# Patient Record
Sex: Female | Born: 1990 | Race: White | Hispanic: No | Marital: Single | Smoking: Light tobacco smoker
Health system: Southern US, Community
[De-identification: ages and names within clinical notes are randomized; demographics above are authoritative.]

## PROBLEM LIST (undated history)

## (undated) DIAGNOSIS — F419 Anxiety disorder, unspecified: Secondary | ICD-10-CM

## (undated) DIAGNOSIS — F32A Depression, unspecified: Secondary | ICD-10-CM

## (undated) DIAGNOSIS — F329 Major depressive disorder, single episode, unspecified: Secondary | ICD-10-CM

## (undated) HISTORY — DX: Major depressive disorder, single episode, unspecified: F32.9

## (undated) HISTORY — DX: Depression, unspecified: F32.A

## (undated) HISTORY — DX: Anxiety disorder, unspecified: F41.9

---
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
## 2012-01-08 ENCOUNTER — Ambulatory Visit (INDEPENDENT_AMBULATORY_CARE_PROVIDER_SITE_OTHER): Payer: BC Managed Care – PPO | Admitting: Internal Medicine

## 2012-01-08 ENCOUNTER — Encounter: Payer: Self-pay | Admitting: Internal Medicine

## 2012-01-08 VITALS — BP 104/72 | HR 60 | Temp 98.4°F | Resp 16 | Ht 62.0 in | Wt 126.4 lb

## 2012-01-08 DIAGNOSIS — F329 Major depressive disorder, single episode, unspecified: Secondary | ICD-10-CM

## 2012-01-08 DIAGNOSIS — F4320 Adjustment disorder, unspecified: Secondary | ICD-10-CM | POA: Insufficient documentation

## 2012-01-08 MED ORDER — SERTRALINE HCL 100 MG PO TABS: 100.0000 mg | ORAL_TABLET | Freq: Every day | ORAL | Status: DC

## 2012-01-08 NOTE — Progress Notes (Signed)
She follows up for her third visit for depression, poor self-esteem, adolescent adjustment reaction.  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 Her mother favor staying in school. Her friends give her differentadvice, and she has trouble making up her mind . She continues with self reflection about the relationship that ended because she tested too much. She has not yet written a letter.  35 minute discussion focused on the way her self-perception paralyzes her-And discuss ways to get out of this to her counseling with psychologist kitchens.  Problem #1 depression secondary to poor self-esteem and adolescent adjustment disorder  Plan-continue Zoloft as it seems to be helping Continue counseling Followup 6 weeks sooner if worse

## 2012-02-26 ENCOUNTER — Ambulatory Visit: Payer: BC Managed Care – PPO | Admitting: Internal Medicine

## 2012-03-27 ENCOUNTER — Other Ambulatory Visit: Payer: Self-pay | Admitting: Internal Medicine

## 2012-04-15 ENCOUNTER — Encounter: Payer: Self-pay | Admitting: Internal Medicine

## 2012-04-15 ENCOUNTER — Ambulatory Visit (INDEPENDENT_AMBULATORY_CARE_PROVIDER_SITE_OTHER): Payer: BC Managed Care – PPO | Admitting: Internal Medicine

## 2012-04-15 VITALS — BP 106/80 | HR 60 | Temp 98.4°F | Resp 16 | Ht 62.5 in | Wt 137.4 lb

## 2012-04-15 DIAGNOSIS — F4320 Adjustment disorder, unspecified: Secondary | ICD-10-CM

## 2012-04-15 DIAGNOSIS — F329 Major depressive disorder, single episode, unspecified: Secondary | ICD-10-CM

## 2012-04-15 DIAGNOSIS — F988 Other specified behavioral and emotional disorders with onset usually occurring in childhood and adolescence: Secondary | ICD-10-CM

## 2012-04-15 MED ORDER — SERTRALINE HCL 100 MG PO TABS: 100.0000 mg | ORAL_TABLET | Freq: Every day | ORAL | Status: DC

## 2012-04-15 MED ORDER — AMPHETAMINE-DEXTROAMPHETAMINE 20 MG PO TABS: 20.0000 mg | ORAL_TABLET | Freq: Two times a day (BID) | ORAL | Status: DC

## 2012-04-15 NOTE — Progress Notes (Signed)
[id number] Patient Active Problem List  Diagnosis  . ADD (attention deficit disorder)  . Depression  . Adjustment disorder of adolescence    Trouble concentrating on schoolwork Is more difficult because now in school part-time with online courses. Medicine does help  HPI: Pt. Is here for F/U 20yo WF Going to school part time (online classes) and planning to travel to [place] for an independent study.  Feels like she is in rut with her schedule which includes working, doing homework, then sleeping.  She is currently living in [facility], and misses seeing her friends in [place] as frequently.  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  She finds time to workout and take piano lessons.  Her schedule is full and keeps her out of the house frequently.  She does not bump into her mom in the house often.  Her youngest brother is at school during the day and her older brother is out of the house often.  During the summer it was harder when she was home with them more frequently.  She spent time cleaning up their mess behind them and complains that her youngest brother is favored by her mother so he does not contribute to his household obligations.  Her mom expects her to help take care of her brothers mess; there is trouble between her and her mom when she does not clean up behind her brothers.  She does not c/o depression because she has been staying so busy.  She states "I like being this busy."  However, she admits to feeling sad that she is not involved in as may auditions during this year off from school.  She associates being an actress acting with her identity.  She shares that her past BF cheated on her and continues to mistreat her when they see each other out.  At first after the breakup she questioned herself, but now she realizes that she is better off without him and does not blame herself for their problems.  She would like to restart Adderall  because she is having trouble focusing on her online classes.  She also admits to rheumanations when going to bed, but is able to fall asleep within 10 minutes. She was able to get out of a relationship where he was cheating on her and recognized that she was worth more than that A/P  1. ADD (attention deficit disorder)   2. Adjustment disorder of adolescence   3. Depression      1) Pt education on life planning.She is doing well and progressing in search for who am I and who might I become 2) Pt education re Adderall and AE's. 3) Refill Adderall 20mg  bid x78mths--Will not use medicines and [place] 4) Refill Zoloft 100mg  qd x40mths--Will need medicines to cover trip to [place] 5) Great progress in relationship with mother/in with brothers  Followup in summer 2014

## 2012-05-24 ENCOUNTER — Telehealth: Payer: Self-pay

## 2012-05-26 NOTE — Progress Notes (Unsigned)
Received approval of prior auth for pt's Adderall. Faxed approval to pharmacy.

## 2013-03-26 ENCOUNTER — Ambulatory Visit (INDEPENDENT_AMBULATORY_CARE_PROVIDER_SITE_OTHER): Payer: BC Managed Care – PPO | Admitting: Internal Medicine

## 2013-03-26 VITALS — BP 102/68 | HR 70 | Temp 98.6°F | Resp 18 | Ht 62.75 in | Wt 125.8 lb

## 2013-03-26 DIAGNOSIS — F4320 Adjustment disorder, unspecified: Secondary | ICD-10-CM

## 2013-03-26 DIAGNOSIS — F988 Other specified behavioral and emotional disorders with onset usually occurring in childhood and adolescence: Secondary | ICD-10-CM

## 2013-03-26 MED ORDER — AMPHETAMINE-DEXTROAMPHETAMINE 20 MG PO TABS: 20.0000 mg | ORAL_TABLET | Freq: Two times a day (BID) | ORAL | Status: DC

## 2013-03-26 NOTE — Progress Notes (Signed)
  Subjective:    Patient ID: Mary Cole, female    DOB: 1990/12/27, 22 y.o.   MRN: [id number]  HPI here for followup after one year off from school  Could not get in to [place] so went to [place] instead and [place]  zoloft--gained 20 lbs so weaned//lost 10 lbs//mood has remained good New boyfriend/doing well Better living on her own for return to school instead of living with her parents [employer]. [employer]-theater//goal [place] in 2-3 yrs    Review of Systems Negative    Objective:   Physical Exam BP 102/68  Pulse 70  Temp(Src) 98.6 F (37 C) (Oral)  Resp 18  Ht 5' 2.75" (1.594 m)  Wt 125 lb 12.8 oz (57.063 kg)  BMI 22.46 kg/m2  SpO2 97%  LMP 02/14/2013 Normal exam       Assessment & Plan:  ADD (attention deficit disorder) - Plan: amphetamine-dextroamphetamine (ADDERALL) 20 MG tablet, amphetamine-dextroamphetamine (ADDERALL) 20 MG tablet, amphetamine-dextroamphetamine (ADDERALL) 20 MG tablet  Adjustment disorder of adolescence  Meds ordered this encounter  Medications  . amphetamine-dextroamphetamine (ADDERALL) 20 MG tablet    Sig: Take 1 tablet (20 mg total) by mouth 2 (two) times daily.    Dispense:  60 tablet    Refill:  0  . amphetamine-dextroamphetamine (ADDERALL) 20 MG tablet    Sig: Take 1 tablet (20 mg total) by mouth 2 (two) times daily.    Dispense:  60 tablet    Refill:  0     04/25/13  . amphetamine-dextroamphetamine (ADDERALL) 20 MG tablet    Sig: Take 1 tablet (20 mg total) by mouth 2 (two) times daily.    Dispense:  60 tablet    Refill:  0     05/26/13   Call in 3 months in followup in 6 months

## 2013-05-24 ENCOUNTER — Telehealth: Payer: Self-pay

## 2013-05-24 NOTE — Telephone Encounter (Signed)
PT STATES SHE RECEIVED A LETTER STATING HER INSURANCE WILL NO LONGER COVER HER ADDERALL UNLESS WE CALL THEM OURSELVES SAY IT IS OK TO CONTINUE YOU MAY REACH PT AT [phone] AND THE NUMBER TO CALL IS [phone]

## 2013-05-25 NOTE — Telephone Encounter (Signed)
Completed PA on phone and received approval, case # [id number], through 05/25/14. Notified pt.

## 2013-06-23 ENCOUNTER — Telehealth: Payer: Self-pay

## 2013-06-23 DIAGNOSIS — F988 Other specified behavioral and emotional disorders with onset usually occurring in childhood and adolescence: Secondary | ICD-10-CM

## 2013-06-23 NOTE — Telephone Encounter (Signed)
PT STATES SHE IS IN NEED OF HER ADDERALL. PLEASE CALL [phone] WHEN READY FOR PICK UP

## 2013-06-23 NOTE — Telephone Encounter (Signed)
Pended.

## 2013-06-25 MED ORDER — AMPHETAMINE-DEXTROAMPHETAMINE 20 MG PO TABS: 20.0000 mg | ORAL_TABLET | Freq: Two times a day (BID) | ORAL | Status: DC

## 2013-06-25 NOTE — Telephone Encounter (Signed)
Meds ordered this encounter  Medications  . amphetamine-dextroamphetamine (ADDERALL) 20 MG tablet    Sig: Take 1 tablet (20 mg total) by mouth 2 (two) times daily.    Dispense:  60 tablet    Refill:  0    Fill 60 days from date of prescription  . amphetamine-dextroamphetamine (ADDERALL) 20 MG tablet    Sig: Take 1 tablet (20 mg total) by mouth 2 (two) times daily.    Dispense:  60 tablet    Refill:  0  . amphetamine-dextroamphetamine (ADDERALL) 20 MG tablet    Sig: Take 1 tablet (20 mg total) by mouth 2 (two) times daily.    Dispense:  60 tablet    Refill:  0    Fill 30 days from date of prescription

## 2013-08-10 ENCOUNTER — Ambulatory Visit (INDEPENDENT_AMBULATORY_CARE_PROVIDER_SITE_OTHER): Payer: BC Managed Care – PPO | Admitting: Family Medicine

## 2013-08-10 VITALS — BP 104/68 | HR 97 | Temp 98.6°F | Resp 18 | Wt 119.0 lb

## 2013-08-10 DIAGNOSIS — Z202 Contact with and (suspected) exposure to infections with a predominantly sexual mode of transmission: Secondary | ICD-10-CM

## 2013-08-10 LAB — POCT WET PREP WITH KOH
Clue Cells Wet Prep HPF POC: NEGATIVE
KOH Prep POC: NEGATIVE
Trichomonas, UA: NEGATIVE
Yeast Wet Prep HPF POC: NEGATIVE

## 2013-08-10 LAB — POCT URINALYSIS DIPSTICK
Bilirubin, UA: NEGATIVE
Glucose, UA: NEGATIVE
Ketones, UA: NEGATIVE
Leukocytes, UA: NEGATIVE
Nitrite, UA: NEGATIVE
Protein, UA: NEGATIVE
Spec Grav, UA: 1.015
Urobilinogen, UA: 0.2
pH, UA: 7

## 2013-08-10 LAB — POCT UA - MICROSCOPIC ONLY
Casts, Ur, LPF, POC: NEGATIVE
Crystals, Ur, HPF, POC: NEGATIVE
Mucus, UA: POSITIVE
Yeast, UA: NEGATIVE

## 2013-08-10 MED ORDER — AZITHROMYCIN 250 MG PO TABS: ORAL_TABLET | ORAL | Status: DC

## 2013-08-10 NOTE — Progress Notes (Signed)
Subjective:    Patient ID: Mary Cole, female    DOB: 1990-11-28, 23 y.o.   MRN: [id number]  HPI  This chart was scribed for Kenyon AnaKurt Lauenstein-MD by Smiley HousemanFallon Davis, Scribe. This patient was seen in room 5 and the patient's care was started at 21 PM.  HPI Comments: Mary Cole is a [age] y.o. female who presents to the [facility] for STD testing.  Pt states that her boyfriend informed her today that he has chlamydia.  Pt states that she has had discharge recently, but she thought her pH was altered.  She is unsure of any spotting, because she was menstruating last week.  Pt states that her and her boyfriend haven't had intercourse recently.  The last time she had intercourse she states that it was painful.  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       No past surgical history on file.  Family History  Problem Relation Age of Onset   COPD Father     History   Social History   Marital Status: Single    Spouse Name: N/A    Number of Children: N/A   Years of Education: N/A   Occupational History   Not on file.   Social History Main Topics   Smoking status: Never Smoker    Smokeless tobacco: Not on file   Alcohol Use: Yes   Drug Use: No   Sexual Activity: Not on file   Other Topics Concern   Not on file   Social History Narrative   No narrative on file    No Known Allergies  Patient Active Problem List   Diagnosis Date Noted   Adjustment disorder of adolescence 01/08/2012   ADD (attention deficit disorder) 11/05/2011   Depression 11/05/2011    No results found for this or any previous visit.   Review of Systems  Constitutional: Negative for fever and chills.  HENT: Negative for congestion and rhinorrhea.   Respiratory: Negative for cough and shortness of breath.   Cardiovascular: Negative for chest pain.  Gastrointestinal: Negative for nausea, vomiting, abdominal pain  and diarrhea.  Genitourinary: Positive for vaginal discharge.  Musculoskeletal: Negative for back pain.  Skin: Negative for color change and rash.  Neurological: Negative for syncope.       Objective:   Physical Exam J. young woman in no acute distress who is alert and appropriate HEENT: Unremarkable Skin: Clear Abdomen: Soft nontender without HSM or tenderness Pelvic exam: Normal external genitalia, scant menses in the vaginal vault, normal cervix with one drop of blood in the os, normal bimanual Results for orders placed in visit on 08/10/13  POCT WET PREP WITH KOH      Result Value Range   Trichomonas, UA Negative     Clue Cells Wet Prep HPF POC neg     Epithelial Wet Prep HPF POC 0-5     Yeast Wet Prep HPF POC neg     Bacteria Wet Prep HPF POC 1+     RBC Wet Prep HPF POC 1-4     WBC Wet Prep HPF POC 0-2     KOH Prep POC Negative    POCT UA - MICROSCOPIC ONLY      Result Value Range   WBC, Ur, HPF, POC 0-4     RBC, urine, microscopic 2-8     Bacteria, U Microscopic 2+  Mucus, UA pos     Epithelial cells, urine per micros 4-6     Crystals, Ur, HPF, POC neg     Casts, Ur, LPF, POC neg     Yeast, UA neg    POCT URINALYSIS DIPSTICK      Result Value Range   Color, UA yellow     Clarity, UA clear     Glucose, UA neg     Bilirubin, UA neg     Ketones, UA neg     Spec Grav, UA 1.015     Blood, UA trace-lysed     pH, UA 7.0     Protein, UA neg     Urobilinogen, UA 0.2     Nitrite, UA neg     Leukocytes, UA Negative            Assessment & Plan:      Signed, Elvina Sidle, MD

## 2013-08-10 NOTE — Patient Instructions (Signed)
Chlamydia, Female °Chlamydia is an infection. It is spread from one person to another person during sexual contact. This infection can be in the cervix, urine tube (urethra), throat, or bottom (rectum). This infection needs treatment. °HOME CARE  °· Take your medicines (antibiotics) as told. Finish them even if you start to feel better. °· Only take medicine as told by your doctor. °· Tell your sex partner(s) that you have chlamydia. They must also be treated. °· Do not have sex until your doctor says it is okay. °· Rest. °· Eat healthy. Drink enough fluids to keep your pee (urine) clear or pale yellow. °· Keep all doctor visits as told. °GET HELP IF: °· You have pain when you pee. °· You have belly pain. °· You have vaginal discharge. °· You have pain during sex. °· You are a woman and have bleeding between periods and after sex. °GET HELP RIGHT AWAY IF:  °· You have a fever that will not go away after 3 days. °· You have a fever and your symptoms suddenly get worse. °· You feel sick to your stomach (nauseous) or you throw up (vomit). °· You sweat much more than normal (diaphoresis). °· You have trouble swallowing. °MAKE SURE YOU:  °· Understand these instructions. °· Will watch your condition. °· Will get help right away if you are not doing well or get worse. °Document Released: 04/16/2008 Document Revised: 04/28/2013 Document Reviewed: 03/15/2013 °ExitCare® Patient Information ©2014 [employer]. ° °

## 2013-08-12 ENCOUNTER — Telehealth: Payer: Self-pay

## 2013-08-12 LAB — GC/CHLAMYDIA PROBE AMP
CT Probe RNA: POSITIVE — AB
GC Probe RNA: NEGATIVE

## 2013-08-12 NOTE — Telephone Encounter (Signed)
Patient returning phone call to get lab results, at the moment it was very busy. Patient understood and says to just give her a call before 6 when she has class.   [phone]

## 2013-08-12 NOTE — Telephone Encounter (Signed)
See labs 

## 2013-09-02 ENCOUNTER — Ambulatory Visit (INDEPENDENT_AMBULATORY_CARE_PROVIDER_SITE_OTHER): Payer: BC Managed Care – PPO | Admitting: Physician Assistant

## 2013-09-02 VITALS — BP 100/60 | HR 72 | Temp 98.0°F | Resp 16 | Ht 62.0 in | Wt 118.0 lb

## 2013-09-02 DIAGNOSIS — J069 Acute upper respiratory infection, unspecified: Secondary | ICD-10-CM

## 2013-09-02 DIAGNOSIS — R0602 Shortness of breath: Secondary | ICD-10-CM

## 2013-09-02 MED ORDER — ALBUTEROL SULFATE (2.5 MG/3ML) 0.083% IN NEBU: 1.2500 mg | INHALATION_SOLUTION | Freq: Once | RESPIRATORY_TRACT | Status: DC

## 2013-09-02 NOTE — Progress Notes (Signed)
   Subjective:    Patient ID: Mary Cole, female    DOB: 1990/08/13, 23 y.o.   MRN: [id number]  HPI Pt presents to clinic with 3 day h/o cold symptoms - she has congestion with some green rhinorrhea and she is here mainly because she had blood in her mucus today.  She is also feeling slight SOB and cannot get a full deep breath.  She is not coughing and has not heard wheezing.  Her brother and father both have asthma but she has never had breathing problems.    OTC meds - cold prep, emergency packets Sick contacts - school peers - [employer] student Flu vaccine - no  Review of Systems  Constitutional: Positive for fever (subjective 4 days ago) and chills.  HENT: Positive for congestion, rhinorrhea (green with blood) and sore throat (irritated).   Respiratory: Negative for cough, shortness of breath and wheezing.   Musculoskeletal: Positive for myalgias.  Neurological: Positive for dizziness and headaches.       Objective:   Physical Exam  Vitals reviewed. Constitutional: She is oriented to person, place, and time. She appears well-developed and well-nourished.  HENT:  Head: Normocephalic and atraumatic.  Right Ear: External ear normal.  Left Ear: External ear normal.  Eyes: Conjunctivae are normal.  Neck: Normal range of motion.  Cardiovascular: Normal rate, regular rhythm and normal heart sounds.   No murmur heard. Pulmonary/Chest: Effort normal and breath sounds normal. No respiratory distress. She has no wheezes.  Lymphadenopathy:       Head (right side): No tonsillar, no preauricular and no occipital adenopathy present.       Head (left side): No tonsillar, no preauricular and no occipital adenopathy present.    She has no cervical adenopathy.       Right: No supraclavicular adenopathy present.       Left: No supraclavicular adenopathy present.  Neurological: She is alert and oriented to person, place, and time.  Skin: Skin is warm and dry.  Psychiatric: She has a normal  mood and affect. Her behavior is normal. Judgment and thought content normal.   Due to patient's complaints of SOB I gave her an albuterol neb - she felt no change in her symptoms and thinks it might be just feeling bad.     Assessment & Plan:  SOB (shortness of breath) - Plan: albuterol (PROVENTIL) (2.5 MG/3ML) 0.083% nebulizer solution 1.25 mg - pt got no relief from the albuterol and her exam was normal so I wonder if pt is just fatigued and body is fighting the URI and her energy level is less giving her the sensation of SOB and she agrees that she thinks that is what this is.  She will RTC if the symptoms worsen or she starts to have wheezing.  URI with cough - pt will use Mucinex that she has at home.  She will do symptomatic treatment and try her neti-pot and nasal saline spray for nasal moisture.  She will take motrin and tylenol for myalgias.  Benny LennertSarah Aleysha Meckler PA-C 09/02/2013 7:06 PM

## 2013-10-30 ENCOUNTER — Telehealth: Payer: Self-pay

## 2013-10-30 DIAGNOSIS — F988 Other specified behavioral and emotional disorders with onset usually occurring in childhood and adolescence: Secondary | ICD-10-CM

## 2013-10-30 NOTE — Telephone Encounter (Signed)
Patient needs a refill on Adderall (gets them 3 months at a time).  [phone]

## 2013-11-01 MED ORDER — AMPHETAMINE-DEXTROAMPHETAMINE 20 MG PO TABS: 20.0000 mg | ORAL_TABLET | Freq: Two times a day (BID) | ORAL | Status: DC

## 2013-11-01 NOTE — Telephone Encounter (Signed)
Dr Merla Richesoolittle, pt has been in for other acute issues, but hasn't seen you for ADD since 03/2013. Do you want to give her any RFs?

## 2013-11-01 NOTE — Telephone Encounter (Signed)
Have her set appt!!! Meds ordered this encounter  Medications  . amphetamine-dextroamphetamine (ADDERALL) 20 MG tablet    Sig: Take 1 tablet (20 mg total) by mouth 2 (two) times daily.    Dispense:  60 tablet    Refill:  0

## 2013-11-02 NOTE — Telephone Encounter (Signed)
Notified pt 1 mos ready but she needs f/up for more. Pt agreed.

## 2014-01-18 ENCOUNTER — Ambulatory Visit (INDEPENDENT_AMBULATORY_CARE_PROVIDER_SITE_OTHER): Payer: BC Managed Care – PPO | Admitting: Internal Medicine

## 2014-01-18 VITALS — BP 90/68 | HR 53 | Temp 98.2°F | Resp 20 | Ht 62.0 in | Wt 116.5 lb

## 2014-01-18 DIAGNOSIS — F988 Other specified behavioral and emotional disorders with onset usually occurring in childhood and adolescence: Secondary | ICD-10-CM

## 2014-01-18 DIAGNOSIS — N899 Noninflammatory disorder of vagina, unspecified: Secondary | ICD-10-CM

## 2014-01-18 DIAGNOSIS — N898 Other specified noninflammatory disorders of vagina: Secondary | ICD-10-CM

## 2014-01-18 DIAGNOSIS — N949 Unspecified condition associated with female genital organs and menstrual cycle: Secondary | ICD-10-CM

## 2014-01-18 DIAGNOSIS — N72 Inflammatory disease of cervix uteri: Secondary | ICD-10-CM

## 2014-01-18 DIAGNOSIS — Z113 Encounter for screening for infections with a predominantly sexual mode of transmission: Secondary | ICD-10-CM

## 2014-01-18 DIAGNOSIS — N9489 Other specified conditions associated with female genital organs and menstrual cycle: Secondary | ICD-10-CM

## 2014-01-18 LAB — POCT WET PREP WITH KOH
KOH Prep POC: NEGATIVE
Trichomonas, UA: NEGATIVE
YEAST WET PREP PER HPF POC: NEGATIVE

## 2014-01-18 MED ORDER — AMPHETAMINE-DEXTROAMPHETAMINE 20 MG PO TABS: 20.0000 mg | ORAL_TABLET | Freq: Two times a day (BID) | ORAL | Status: DC

## 2014-01-18 MED ORDER — AZITHROMYCIN 500 MG PO TABS: 1000.0000 mg | ORAL_TABLET | Freq: Once | ORAL | Status: DC

## 2014-01-18 MED ORDER — CEFTRIAXONE SODIUM 1 G IJ SOLR
250.0000 mg | Freq: Once | INTRAMUSCULAR | Status: AC
  Administered 2014-01-18: 250 mg via INTRAMUSCULAR

## 2014-01-18 NOTE — Patient Instructions (Signed)
We have given you an injection of antibiotics today.  Take the 2 azithromycin pills at one time tomorrow.  This will cover you for both gonorrhea and chlamydia.  This may be overkill, but I am concerned because you were so tender on exam.  Abstain from sexual activity for the next 7 days.  I will be in touch about your labs as soon as they are back (usually about 48 hours)

## 2014-01-18 NOTE — Progress Notes (Addendum)
Subjective:    Patient ID: Mary Cole, female    DOB: 06/10/1991, 23 y.o.   MRN: [id number]  HPI   Ms. Mary Cole is a very pleasant 23 yr old female here with concern for 2 days of vaginal irritation.  She has associated decreased appetite and low back pain - though these symptoms predated the vaginal symptoms.  She noted some bleeding after sex recently, as well as associated burning.  She denies pain with intercourse.  She denies vaginal discharge or urinary symptoms.  LMP 01/07/14.  She uses OCPs for contraception.  Currently sexually active with 1 female partner - no condoms.  No specific concern for STI but would like testing . Last tested a few month ago at which time she was positive for chlamydia.  She was treated for this.  She denies fever, chills, abd pain, NV.     Review of Systems  Constitutional: Positive for appetite change. Negative for fever and chills.  Respiratory: Negative for cough, shortness of breath and wheezing.   Gastrointestinal: Negative for nausea, vomiting and abdominal pain.  Genitourinary: Positive for vaginal bleeding. Negative for dysuria, hematuria, vaginal discharge, genital sores, pelvic pain and dyspareunia.  Musculoskeletal: Positive for back pain.       Objective:   Physical Exam  Vitals reviewed. Constitutional: She is oriented to person, place, and time. She appears well-developed and well-nourished. No distress.  HENT:  Head: Normocephalic and atraumatic.  Eyes: Conjunctivae are normal. No scleral icterus.  Cardiovascular: Normal rate, regular rhythm and normal heart sounds.   Pulmonary/Chest: Effort normal and breath sounds normal. She has no wheezes. She has no rales.  Abdominal: Soft. Bowel sounds are normal. There is no tenderness. There is no CVA tenderness.  Genitourinary: There is no rash, tenderness or lesion on the right labia. There is no rash, tenderness or lesion on the left labia. Cervix exhibits motion tenderness and discharge.  Right adnexum displays tenderness. Right adnexum displays no mass and no fullness. Left adnexum displays no mass, no tenderness and no fullness. No vaginal discharge found.  Neurological: She is alert and oriented to person, place, and time.  Skin: Skin is warm and dry.  Psychiatric: She has a normal mood and affect. Her behavior is normal.    Results for orders placed in visit on 01/18/14  POCT WET PREP WITH KOH      Result Value Ref Range   Trichomonas, UA Negative     Clue Cells Wet Prep HPF POC 0-2     Epithelial Wet Prep HPF POC 2-3     Yeast Wet Prep HPF POC neg     Bacteria Wet Prep HPF POC 2+     RBC Wet Prep HPF POC 0-2     WBC Wet Prep HPF POC 15-25     KOH Prep POC Negative          Assessment & Plan:  Vaginal irritation - Plan: POCT Wet Prep with KOH, GC/Chlamydia Probe Amp  Screen for STD (sexually transmitted disease) - Plan: POCT Wet Prep with KOH, GC/Chlamydia Probe Amp, HIV antibody, RPR  ADD (attention deficit disorder) - Plan: amphetamine-dextroamphetamine (ADDERALL) 20 MG tablet  Cervicitis - Plan: cefTRIAXone (ROCEPHIN) injection 250 mg, azithromycin (ZITHROMAX) 500 MG tablet  Cervical motion tenderness - Plan: cefTRIAXone (ROCEPHIN) injection 250 mg, azithromycin (ZITHROMAX) 500 MG tablet   Ms. Mary Cole is a pleasant 23 yr old female here with complaint of vaginal irritation.  She had some associated post-coital bleeding.  On exam there is purulent discharge from the cervix as well as CMT.  Because of this, favor treating empirically fo gc/chlamydia.  Pt agrees with this.  CTX given in clinic.  Azithro sent to pharmacy.  Labs pending.  Pt to call or RTC if worsening or not improving  E. Frances FurbishElizabeth Egan MHS, PA-C [facility] 6/30/201510:46 PM    I have completed the patient encounter in its entirety as documented by PA Debbra RidingEgan, with editing by me where necessary. Robert P. Merla Richesoolittle, M.D.

## 2014-01-18 NOTE — Progress Notes (Signed)
Patient ID: Mary Cole, female   DOB: 06-May-1991, 23 y.o.   MRN: [id number] This chart was scribed for Ellamae Siaobert Doolittle, MD by Joaquin MusicKristina Sanchez-Matthews, ED Scribe. This patient was seen in room Room/bed 12 and the patient's care was started at 10:08 PM.  Chief Complaint  Patient presents with  . Medication Refill    Adderall   Mary Cole is a 23 y.o. female with hx of ADD and depression who [facility], Adderall, refill. 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 [place] [place] [place]. 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 She is requesting a refill of her Adderall; "has not had it for a while". She has noticed that she struggles with reading a full page of plays due to inability to focus. Pt states she use to read for fun but states after 3 pages into a book, she does not realize what she is reading.  Pt states she has the same sexual partner. Last Chlamydia tx was March 2015.  Review of systems and exam both stable  For attention deficit disorder she will continue medications Meds ordered this encounter  Medications  . amphetamine-dextroamphetamine (ADDERALL) 20 MG tablet    Sig: Take 1 tablet (20 mg total) by mouth 2 (two) times daily. For 30 d after date signed    Dispense:  60 tablet    Refill:  0  . amphetamine-dextroamphetamine (ADDERALL) 20 MG tablet    Sig: Take 1 tablet (20 mg total) by mouth 2 (two) times daily. For 60d after date signed    Dispense:  60 tablet    Refill:  0  . amphetamine-dextroamphetamine (ADDERALL) 20 MG tablet    Sig: Take 1 tablet (20 mg total) by mouth 2 (two) times daily.    Dispense:  60 tablet    Refill:  0   See the remainder of this office visit under the note completed by [facility] Debbra RidingEgan     I have completed the patient encounter in its entirety as documented by the scribe and PAC, with  editing by me where necessary. Robert P. Merla Richesoolittle, M.D.

## 2014-01-19 LAB — RPR

## 2014-01-19 LAB — HIV ANTIBODY (ROUTINE TESTING W REFLEX): HIV 1&2 Ab, 4th Generation: NONREACTIVE

## 2014-01-20 LAB — GC/CHLAMYDIA PROBE AMP
CT PROBE, AMP APTIMA: POSITIVE — AB
GC Probe RNA: NEGATIVE

## 2014-01-22 ENCOUNTER — Telehealth: Payer: Self-pay

## 2014-01-22 NOTE — Telephone Encounter (Signed)
PATIENT STATES SHE SAW ELIZABETH EGAN ON Tuesday FOR A VAGINAL IRRITATION. SHE GAVE HER AN INJECTION AND ALSO PRESCRIBED HER AZITHROMYCIN. SHE STARTED TO FEEL BETTER AFTER SHE FIRST STARTED TO USE IT, BUT A FEW DAYS AGO SHE STARTED TO GET SWOLLEN AND IRRITATED AGAIN. SHE WAS ALSO DIAGNOSED WITH CHLAMYDIA. SHE IS NOT SURE IF THE MEDICATION IS NOT WORKING OR IF SHE IS HAVING AN ALLERGIC REACTION. [place] [facility]  BEST PHONE [phone] (CELL)  IF SHE NEEDS A DIFFERENT MEDICATION, [place]ARMACY [place] WHEN WE CALL HER BACK.  MBC

## 2014-01-23 NOTE — Telephone Encounter (Signed)
Please advise. Note indicates call or come in, [place].

## 2014-01-23 NOTE — Telephone Encounter (Signed)
She needs re-evaluation, either here or where she is.

## 2014-01-24 NOTE — Telephone Encounter (Signed)
LM for rtn call. 

## 2014-01-24 NOTE — Telephone Encounter (Signed)
Pt states she will RTC- transferred to try to make an appt.

## 2015-05-11 ENCOUNTER — Ambulatory Visit (INDEPENDENT_AMBULATORY_CARE_PROVIDER_SITE_OTHER): Payer: BC Managed Care – PPO

## 2015-05-11 ENCOUNTER — Ambulatory Visit (INDEPENDENT_AMBULATORY_CARE_PROVIDER_SITE_OTHER): Payer: BC Managed Care – PPO | Admitting: Physician Assistant

## 2015-05-11 ENCOUNTER — Telehealth: Payer: Self-pay | Admitting: Family Medicine

## 2015-05-11 VITALS — BP 118/76 | HR 62 | Temp 98.7°F | Resp 16 | Ht 62.0 in | Wt 130.0 lb

## 2015-05-11 DIAGNOSIS — R059 Cough, unspecified: Secondary | ICD-10-CM

## 2015-05-11 DIAGNOSIS — R05 Cough: Secondary | ICD-10-CM

## 2015-05-11 DIAGNOSIS — R0602 Shortness of breath: Secondary | ICD-10-CM | POA: Diagnosis not present

## 2015-05-11 LAB — POCT CBC
Granulocyte percent: 68.3 %G (ref 37–80)
HEMATOCRIT: 39.8 % (ref 37.7–47.9)
Hemoglobin: 13.6 g/dL (ref 12.2–16.2)
Lymph, poc: 2 (ref 0.6–3.4)
MCH: 32.8 pg — AB (ref 27–31.2)
MCHC: 34.3 g/dL (ref 31.8–35.4)
MCV: 95.6 fL (ref 80–97)
MID (CBC): 0.3 (ref 0–0.9)
MPV: 7.1 fL (ref 0–99.8)
POC GRANULOCYTE: 5 (ref 2–6.9)
POC LYMPH PERCENT: 27.2 %L (ref 10–50)
POC MID %: 4.5 %M (ref 0–12)
Platelet Count, POC: 243 10*3/uL (ref 142–424)
RBC: 4.16 M/uL (ref 4.04–5.48)
RDW, POC: 12.5 %
WBC: 7.3 10*3/uL (ref 4.6–10.2)

## 2015-05-11 LAB — D-DIMER, QUANTITATIVE: D-Dimer, Quant: 0.28 ug/mL-FEU (ref 0.00–0.48)

## 2015-05-11 LAB — POCT URINE PREGNANCY: Preg Test, Ur: NEGATIVE

## 2015-05-11 MED ORDER — ALBUTEROL SULFATE (2.5 MG/3ML) 0.083% IN NEBU
2.5000 mg | INHALATION_SOLUTION | Freq: Once | RESPIRATORY_TRACT | Status: AC
  Administered 2015-05-11: 2.5 mg via RESPIRATORY_TRACT

## 2015-05-11 MED ORDER — HYDROCODONE-HOMATROPINE 5-1.5 MG/5ML PO SYRP
5.0000 mL | ORAL_SOLUTION | Freq: Three times a day (TID) | ORAL | Status: AC | PRN
Start: 2015-05-11 — End: ?

## 2015-05-11 MED ORDER — BENZONATATE 100 MG PO CAPS: 100.0000 mg | ORAL_CAPSULE | Freq: Three times a day (TID) | ORAL | Status: AC | PRN

## 2015-05-11 MED ORDER — IPRATROPIUM BROMIDE 0.02 % IN SOLN
0.5000 mg | Freq: Once | RESPIRATORY_TRACT | Status: AC
  Administered 2015-05-11: 0.5 mg via RESPIRATORY_TRACT

## 2015-05-11 MED ORDER — PREDNISONE 20 MG PO TABS: 40.0000 mg | ORAL_TABLET | Freq: Every day | ORAL | Status: AC

## 2015-05-11 NOTE — Progress Notes (Signed)
05/12/2015 at 2:54 PM  Mary Cole / DOB: 07-Dec-1990 / MRN: [id number]  The patient has ADD (attention deficit disorder); Depression; and Adjustment disorder of adolescence on her problem list.  SUBJECTIVE  Mary Cole is a 24 y.o. female who complains of cough and mild SOB that started this morning.  Reports she is a daily smoker and takes OCPs.  Smokes roughly 3-4 cigarettes daily.  Had a cold with rhinorrhea, sore throat, and nasal congestion roughly 3 weeks ago. Her cough started after her other symptoms resolved.  Reports she was able to run six miles yesterday.  She is concerned about her SOB. Denies chest pain and posterior leg pain or pain with ambulation.   She  has a past medical history of Depression and Anxiety.    Medications reviewed and updated by myself where necessary, and exist elsewhere in the encounter.   Ms. Rzasa has No Known Allergies. She  reports that she has been smoking.  She has never used smokeless tobacco. She reports that she drinks alcohol. She reports that she does not use illicit drugs. She  has no sexual activity history on file. The patient  has no past surgical history on file.  Her family history includes COPD in her father.  Review of Systems  Constitutional: Negative for fever.  HENT: Negative for congestion and sore throat.   Respiratory: Positive for cough and shortness of breath (improved post duo neb x 1).   Cardiovascular: Negative for chest pain.  Genitourinary: Negative for dysuria.  Musculoskeletal: Negative for myalgias.  Skin: Negative for rash.  Neurological: Negative for dizziness and headaches.    OBJECTIVE  Her  height is  (1.575 m) and weight is 130 lb (58.968 kg). Her oral temperature is 98.7 F (37.1 C). Her blood pressure is 118/76 and her pulse is 62. Her respiration is 16 and oxygen saturation is 98%.  The patient's body mass index is 23.77 kg/(m^2).  Physical Exam  Constitutional: She is oriented to  person, place, and time. She appears well-developed and well-nourished. No distress.  HENT:  Right Ear: Hearing, tympanic membrane, external ear and ear canal normal.  Left Ear: Hearing, tympanic membrane, external ear and ear canal normal.  Nose: No mucosal edema. Right sinus exhibits no maxillary sinus tenderness and no frontal sinus tenderness. Left sinus exhibits no maxillary sinus tenderness and no frontal sinus tenderness.  Mouth/Throat: Uvula is midline, oropharynx is clear and moist and mucous membranes are normal.  Cardiovascular: Normal rate, regular rhythm, normal heart sounds and intact distal pulses.  Exam reveals no gallop and no friction rub.   No murmur heard. Respiratory: Effort normal and breath sounds normal. She has no wheezes. She has no rales.  Musculoskeletal: Normal range of motion.  Neurological: She is alert and oriented to person, place, and time.  Skin: Skin is warm and dry. She is not diaphoretic.  Psychiatric: She has a normal mood and affect.    Results for orders placed or performed in visit on 05/11/15 (from the past 24 hour(s))  POCT CBC     Status: Abnormal   Collection Time: 05/11/15  7:44 PM  Result Value Ref Range   WBC 7.3 4.6 - 10.2 K/uL   Lymph, poc 2.0 0.6 - 3.4   POC LYMPH PERCENT 27.2 10 - 50 %L   MID (cbc) 0.3 0 - 0.9   POC MID % 4.5 0 - 12 %M   POC Granulocyte 5.0 2 - 6.9  Granulocyte percent 68.3 37 - 80 %G   RBC 4.16 4.04 - 5.48 M/uL   Hemoglobin 13.6 12.2 - 16.2 g/dL   HCT, POC 16.139.8 09.637.7 - 47.9 %   MCV 95.6 80 - 97 fL   MCH, POC 32.8 (A) 27 - 31.2 pg   MCHC 34.3 31.8 - 35.4 g/dL   RDW, POC 04.512.5 %   Platelet Count, POC 243 142 - 424 K/uL   MPV 7.1 0 - 99.8 fL  POCT urine pregnancy     Status: None   Collection Time: 05/11/15  7:44 PM  Result Value Ref Range   Preg Test, Ur Negative Negative  D-dimer, quantitative (not at [facility])     Status: None   Collection Time: 05/11/15  8:25 PM  Result Value Ref Range   D-Dimer, Quant 0.28  0.00 - 0.48 ug/mL-FEU   Narrative   Performed at:  [facility]                [address], [address]                [place], [place] [address]   [facility] reading (PRIMARY) by  Dr. Neva SeatGreene: Negative.   ASSESSMENT & PLAN  Zollie ScaleOlivia was seen today for chest congestion and immunizations.  Diagnoses and all orders for this visit:  Cough -     POCT CBC -     DG Chest 2 View; Future -     POCT urine pregnancy -     albuterol (PROVENTIL) (2.5 MG/3ML) 0.083% nebulizer solution 2.5 mg; Take 3 mLs (2.5 mg total) by nebulization once. -     ipratropium (ATROVENT) nebulizer solution 0.5 mg; Take 2.5 mLs (0.5 mg total) by nebulization once. -     predniSONE (DELTASONE) 20 MG tablet; Take 2 tablets (40 mg total) by mouth daily with breakfast. -     benzonatate (TESSALON) 100 MG capsule; Take 1-2 capsules (100-200 mg total) by mouth 3 (three) times daily as needed for cough. -     HYDROcodone-homatropine (HYCODAN) 5-1.5 MG/5ML syrup; Take 5 mLs by mouth every 8 (eight) hours as needed for cough.  SOB (shortness of breath): D-dimer negative.  Patient did improve with duoneb, however lung exam did not change.  Most likely secondary to post viral cough/bronchitis.  Given improvement with breathing treatment will try low dose steroid burst.  Advised that if her symptoms change to RTC or go directly to the ED for further evaluation and management.  -     D-dimer, quantitative (not at [facility]) -     albuterol (PROVENTIL) (2.5 MG/3ML) 0.083% nebulizer solution 2.5 mg; Take 3 mLs (2.5 mg total) by nebulization once. -     ipratropium (ATROVENT) nebulizer solution 0.5 mg; Take 2.5 mLs (0.5 mg total) by nebulization once.    The patient was advised to call or come back to clinic if she does not see an improvement in symptoms, or worsens with the above plan.   Deliah BostonMichael Kamoni Gentles, MHS, PA-C [facility] 05/12/2015 2:54 PM

## 2015-05-11 NOTE — Telephone Encounter (Signed)
Call from [facility]. DDimer normal at 0.28. Lab - Please call pt with normal result.  To Mary BostonMichael Cole - FYI.

## 2015-05-12 NOTE — Telephone Encounter (Signed)
LMOM of normal results.

## 2015-07-01 ENCOUNTER — Encounter (HOSPITAL_COMMUNITY): Payer: Self-pay | Admitting: Emergency Medicine

## 2015-07-01 ENCOUNTER — Other Ambulatory Visit (HOSPITAL_COMMUNITY)
Admission: RE | Admit: 2015-07-01 | Discharge: 2015-07-01 | Disposition: A | Discharge status: Home / Self Care | Payer: BC Managed Care – PPO | Source: Ambulatory Visit | Attending: Family Medicine | Admitting: Family Medicine

## 2015-07-01 ENCOUNTER — Emergency Department (INDEPENDENT_AMBULATORY_CARE_PROVIDER_SITE_OTHER)
Admission: EM | Admit: 2015-07-01 | Discharge: 2015-07-01 | Disposition: A | Discharge status: Home / Self Care | Payer: BC Managed Care – PPO | Source: Home / Self Care | Attending: Family Medicine | Admitting: Family Medicine

## 2015-07-01 DIAGNOSIS — L290 Pruritus ani: Secondary | ICD-10-CM | POA: Diagnosis not present

## 2015-07-01 DIAGNOSIS — N760 Acute vaginitis: Secondary | ICD-10-CM | POA: Insufficient documentation

## 2015-07-01 DIAGNOSIS — Z113 Encounter for screening for infections with a predominantly sexual mode of transmission: Secondary | ICD-10-CM | POA: Diagnosis not present

## 2015-07-01 LAB — POCT URINALYSIS DIP (DEVICE)
BILIRUBIN URINE: NEGATIVE
Glucose, UA: NEGATIVE mg/dL
HGB URINE DIPSTICK: NEGATIVE
KETONES UR: NEGATIVE mg/dL
Leukocytes, UA: NEGATIVE
Nitrite: NEGATIVE
PH: 7 (ref 5.0–8.0)
PROTEIN: NEGATIVE mg/dL
Specific Gravity, Urine: 1.015 (ref 1.005–1.030)
Urobilinogen, UA: 0.2 mg/dL (ref 0.0–1.0)

## 2015-07-01 LAB — POCT PREGNANCY, URINE: Preg Test, Ur: NEGATIVE

## 2015-07-01 MED ORDER — HYDROCORTISONE ACETATE 25 MG RE SUPP: 25.0000 mg | Freq: Every day | RECTAL | Status: AC

## 2015-07-01 NOTE — Discharge Instructions (Signed)
It was nice seeing you today. I will call with all test results as soon as I get them. Please see your PCP soon if still having anal itching. Use anusol supp for now.  Anal Pruritus  Anal pruritus is an itchy feeling in the anus and the skin in the anal area. This is common and can be caused by many things. It often occurs when the area becomes moist. Moisture may be due to sweating or a small amount of stool (feces) that is left on the area because of poor personal cleaning. Some other causes include:  Perfumed soaps and sprays.  Colored toilet paper.  Chemicals in the foods that you eat.  Dietary factors, such as caffeine, beer, milk products, chocolate, nuts, citrus fruits, tomatoes, spicy seasonings, jalapeno peppers, and salsa.  Hemorrhoids, fissures, infections, and other anal diseases.  Excessive washing.  Overuse of laxatives.  Skin disorders (psoriasis, eczema, or seborrhea).  Some medical disorders, such as diabetes or thyroid problems.  Diarrhea.  STDs (sexually transmitted diseases).  Some cancers. In many cases, the cause is not known. The itching usually goes away with treatment and home care. Scratching can cause further skin damage. HOME CARE INSTRUCTIONS  Pay attention to any changes in your symptoms. Take these actions to help with your itching: Skin Care  Practice good hygiene.  Clean the anal area gently with wet toilet paper, baby wipes, or a wet washcloth after every bowel movement and at bedtime.  Avoid using soaps on the anal area.  Dry the area thoroughly. Pat the area dry with toilet paper or a towel.  Do not scrub the anal area with anything, including toilet paper.  Do not scratch the itchy area. Scratching produces more damage and makes the itching worse.  Take sitz baths in warm water as told by your health care provider. Pat the area dry with a soft cloth after each bath.  Use creams or ointments as told by your health care provider. Zinc  oxide ointment or a moisture barrier cream can be applied several times per day to protect the skin.  Do not use anything that irritates the skin, such as bubble baths, scented toilet paper, or genital deodorants. General Instructions  Take over-the-counter and prescription medicines only as told by your health care provider.  Talk with your health care provider about fiber supplements. These are helpful in keeping your stool normal if you have frequent loose stools.  Wear cotton underwear and loose clothing.  Keep all follow-up visits as told by your health care provider. This is important. SEEK MEDICAL CARE IF:  Your itching does not improve in several days.  Your itching gets worse.  You have a fever.  You have redness, swelling, or pain in the anal area.  You have fluid, blood, or pus coming from the anal area.   This information is not intended to replace advice given to you by your health care provider. Make sure you discuss any questions you have with your health care provider.   Document Released: 01/07/2011 Document Revised: 03/29/2015 Document Reviewed: 10/03/2014 Elsevier Interactive Patient Education [employer]2016 Elsevier Inc.

## 2015-07-01 NOTE — ED Provider Notes (Signed)
CSN: [id number]     Arrival date & time 07/01/15  1450 History   First MD Initiated Contact with Patient 07/01/15 1640     Chief Complaint  Patient presents with  . Exposure to STD  . Anal Itching   (Consider location/radiation/quality/duration/timing/severity/associated sxs/prior Treatment) Patient is a 24 y.o. female presenting with vaginal discharge. The history is provided by the patient. No language interpreter was used.  Vaginal Discharge Quality:  Watery and white Severity:  Severe Onset quality:  Gradual Duration:  2 weeks Timing:  Constant Progression:  Worsening Chronicity:  New Context: not after intercourse, not after urination and not during intercourse   Context comment:  Occurs anytime. At times it worsens after exercise Relieved by:  Nothing Worsened by:  Nothing tried Ineffective treatments:  None tried Associated symptoms: no abdominal pain, no dyspareunia, no dysuria, no fever, no nausea, no rash, no vaginal itching and no vomiting   Associated symptoms comment:  Unprotected sex 1 month ago. Number of sexual sexual partner in the last 6 month is about 3-4. Risk factors: STI exposure and unprotected sex   Risk factors: no new sexual partner   Anal itching: C/O itching on her anus 3 weeks ago, it started of with some funny feeling on her anus. She has been using Pre-H this made her feel better. Now she is also itching internally. She stated she has hx of hypochondriasis so she is also worried about cancer.  Past Medical History  Diagnosis Date  . Depression   . Anxiety    History reviewed. No pertinent past surgical history. Family History  Problem Relation Age of Onset  . COPD Father    Social History  Substance Use Topics  . Smoking status: Light Tobacco Smoker  . Smokeless tobacco: Never Used  . Alcohol Use: 0.0 oz/week    0 Standard drinks or equivalent per week   OB History    No data available     Review of Systems  Constitutional: Negative  for fever.  Respiratory: Negative.   Cardiovascular: Negative.   Gastrointestinal: Negative for nausea, vomiting and abdominal pain.  Genitourinary: Positive for vaginal discharge. Negative for dysuria, urgency, decreased urine volume, difficulty urinating, genital sores, pelvic pain and dyspareunia.       Internal anal itching. Vaginal discharge  All other systems reviewed and are negative.   Allergies  Review of patient's allergies indicates no known allergies.  Home Medications   Prior to Admission medications   Medication Sig Start Date End Date Taking? Authorizing Provider  benzonatate (TESSALON) 100 MG capsule Take 1-2 capsules (100-200 mg total) by mouth 3 (three) times daily as needed for cough. 05/11/15   Ofilia NeasMichael L Clark, PA-C  HYDROcodone-homatropine [facility](HYCODAN) 5-1.5 MG/5ML syrup Take 5 mLs by mouth every 8 (eight) hours as needed for cough. 05/11/15   Ofilia NeasMichael L Clark, PA-C  Norethindrone Acetate-Ethinyl Estradiol (LOESTRIN 1.5/30, 21,) 1.5-30 MG-MCG tablet Take 1 tablet by mouth daily.    Historical Provider, MD  predniSONE (DELTASONE) 20 MG tablet Take 2 tablets (40 mg total) by mouth daily with breakfast. 05/11/15   Ofilia NeasMichael L Clark, PA-C   Meds Ordered and Administered this Visit  Medications - No data to display  BP 98/64 mmHg  Pulse 69  Temp(Src) 98.4 F (36.9 C) (Oral)  SpO2 100%  LMP 06/24/2015 No data found.   Physical Exam  Constitutional: She appears well-developed. No distress.  Cardiovascular: Normal rate, regular rhythm and normal heart sounds.   No murmur heard.  Pulmonary/Chest: Effort normal and breath sounds normal. No respiratory distress. She has no wheezes.  Abdominal: Soft. Bowel sounds are normal. She exhibits no distension. There is no tenderness.  Genitourinary: Rectal exam shows no external hemorrhoid, no internal hemorrhoid, no fissure, no mass, no tenderness and anal tone normal. No labial fusion. There is no tenderness on the right labia.  There is no tenderness on the left labia. Cervix exhibits no motion tenderness and no discharge. Vaginal discharge found.  I was unable to use anoscope to examine her today.  Nursing note and vitals reviewed.   ED Course  Procedures (including critical care time)  Labs Review Labs Reviewed - No data to display  Imaging Review No results found.   Visual Acuity Review  Right Eye Distance:   Left Eye Distance:   Bilateral Distance:    Right Eye Near:   Left Eye Near:    Bilateral Near:      Urinalysis    Component Value Date/Time   LABSPEC 1.015 07/01/2015 1642   PHURINE 7.0 07/01/2015 1642   GLUCOSEU NEGATIVE 07/01/2015 1642   HGBUR NEGATIVE 07/01/2015 1642   BILIRUBINUR NEGATIVE 07/01/2015 1642   BILIRUBINUR neg 08/10/2013 2040   KETONESUR NEGATIVE 07/01/2015 1642   PROTEINUR NEGATIVE 07/01/2015 1642   PROTEINUR neg 08/10/2013 2040   UROBILINOGEN 0.2 07/01/2015 1642   UROBILINOGEN 0.2 08/10/2013 2040   NITRITE NEGATIVE 07/01/2015 1642   NITRITE neg 08/10/2013 2040   LEUKOCYTESUR NEGATIVE 07/01/2015 1642        MDM  No diagnosis found. Vaginitis  Anal pruritus  UA and urine pregnancy test negative today. STD counseling done. Vaginal discharge specimen sent to lab for GC/CHlamydia and wet prep. HIV test done today. I will call her with result.  Anal exam looks good with no hemorrhoid. Itching might be skin irritation. Anusol prescribed. I recommended f/u with PCP soon for GI referral if no improvement.    Doreene Eland, MD 07/01/15 [phone]

## 2015-07-01 NOTE — ED Notes (Signed)
C/o vag d/c x2 week associated w/anal itching  Would like to be tested for STD due to mult partners A&O x4.... No acute distress.

## 2015-07-02 LAB — HIV ANTIBODY (ROUTINE TESTING W REFLEX): HIV SCREEN 4TH GENERATION: NONREACTIVE

## 2015-07-03 LAB — CERVICOVAGINAL ANCILLARY ONLY
CHLAMYDIA, DNA PROBE: NEGATIVE
NEISSERIA GONORRHEA: NEGATIVE
Trichomonas: NEGATIVE
WET PREP (BD AFFIRM): NEGATIVE

## 2015-07-03 NOTE — ED Notes (Addendum)
Final report of HIV,  STD testing negative

## 2016-10-24 IMAGING — CR DG CHEST 2V
2 series · 2 of 2 positions shown · non-contrast
Comparison: None.

CLINICAL DATA: 24-year-old with cough for 3 weeks. Shortness of
breath today. Initial encounter.

EXAM:
CHEST  2 VIEW

[PA]
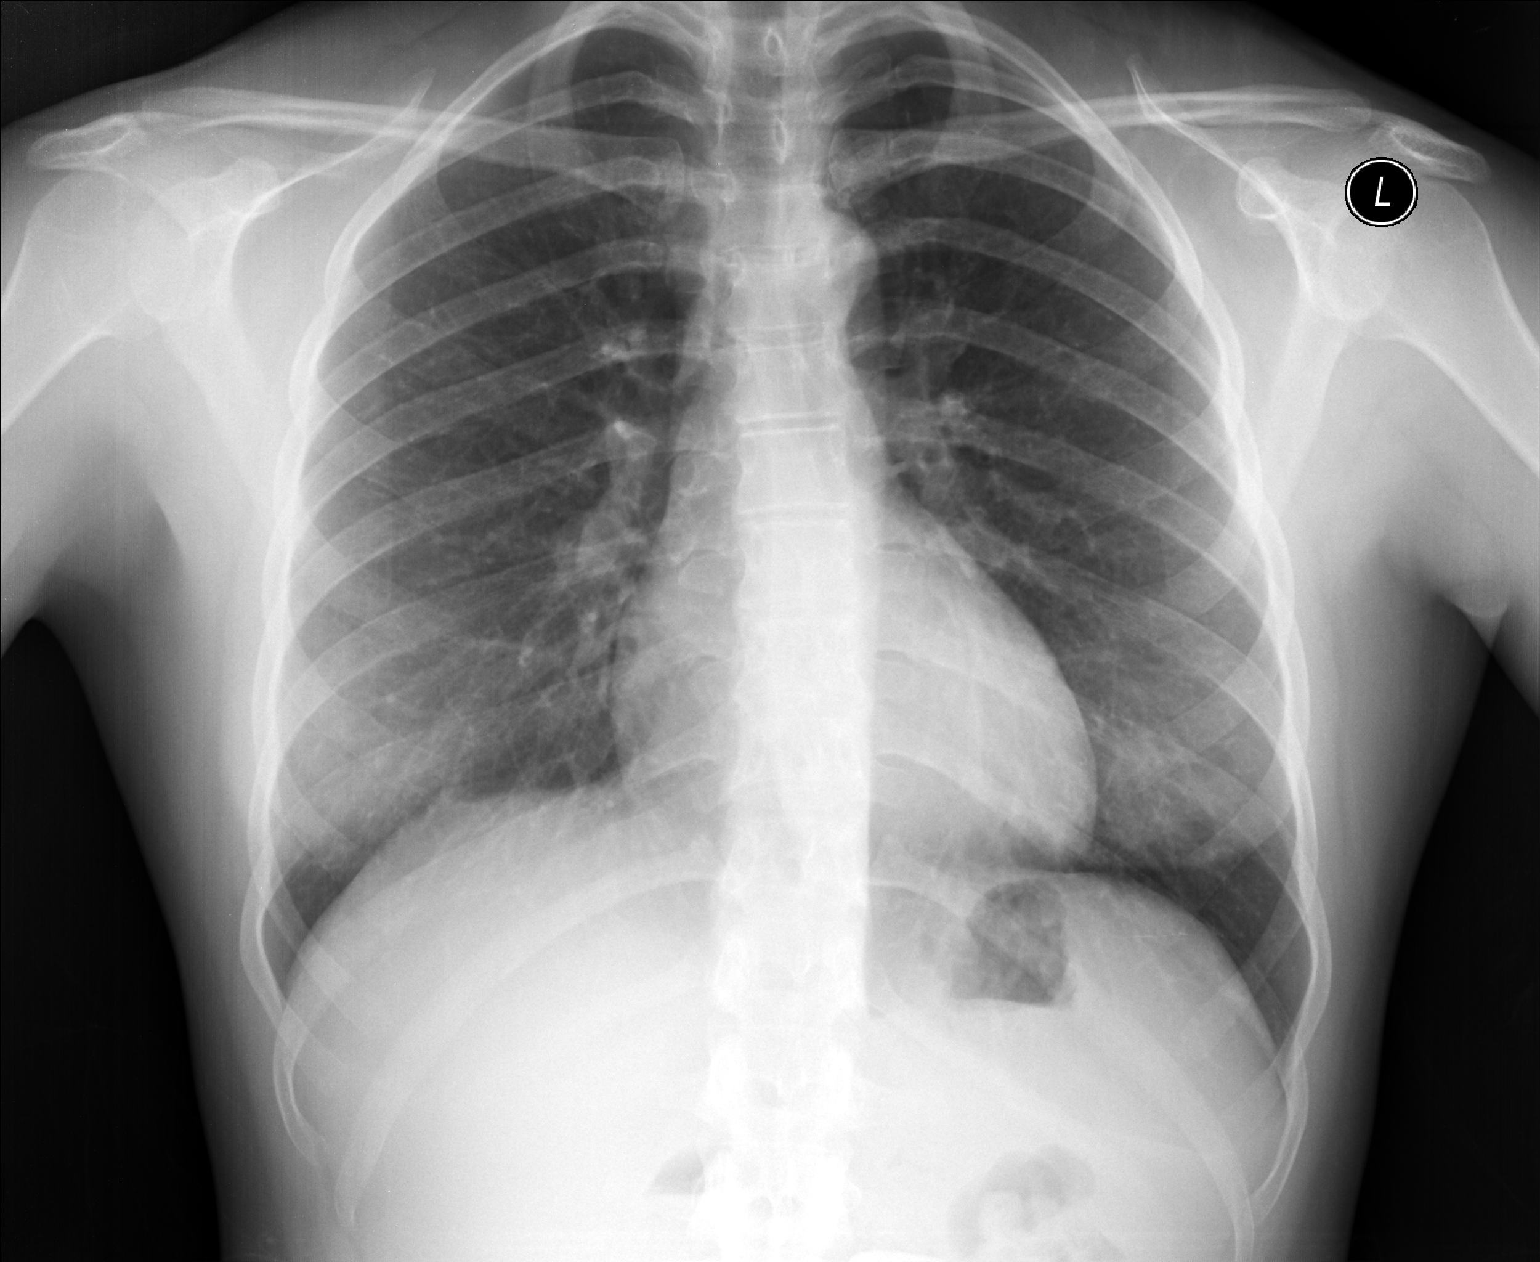

[lateral]
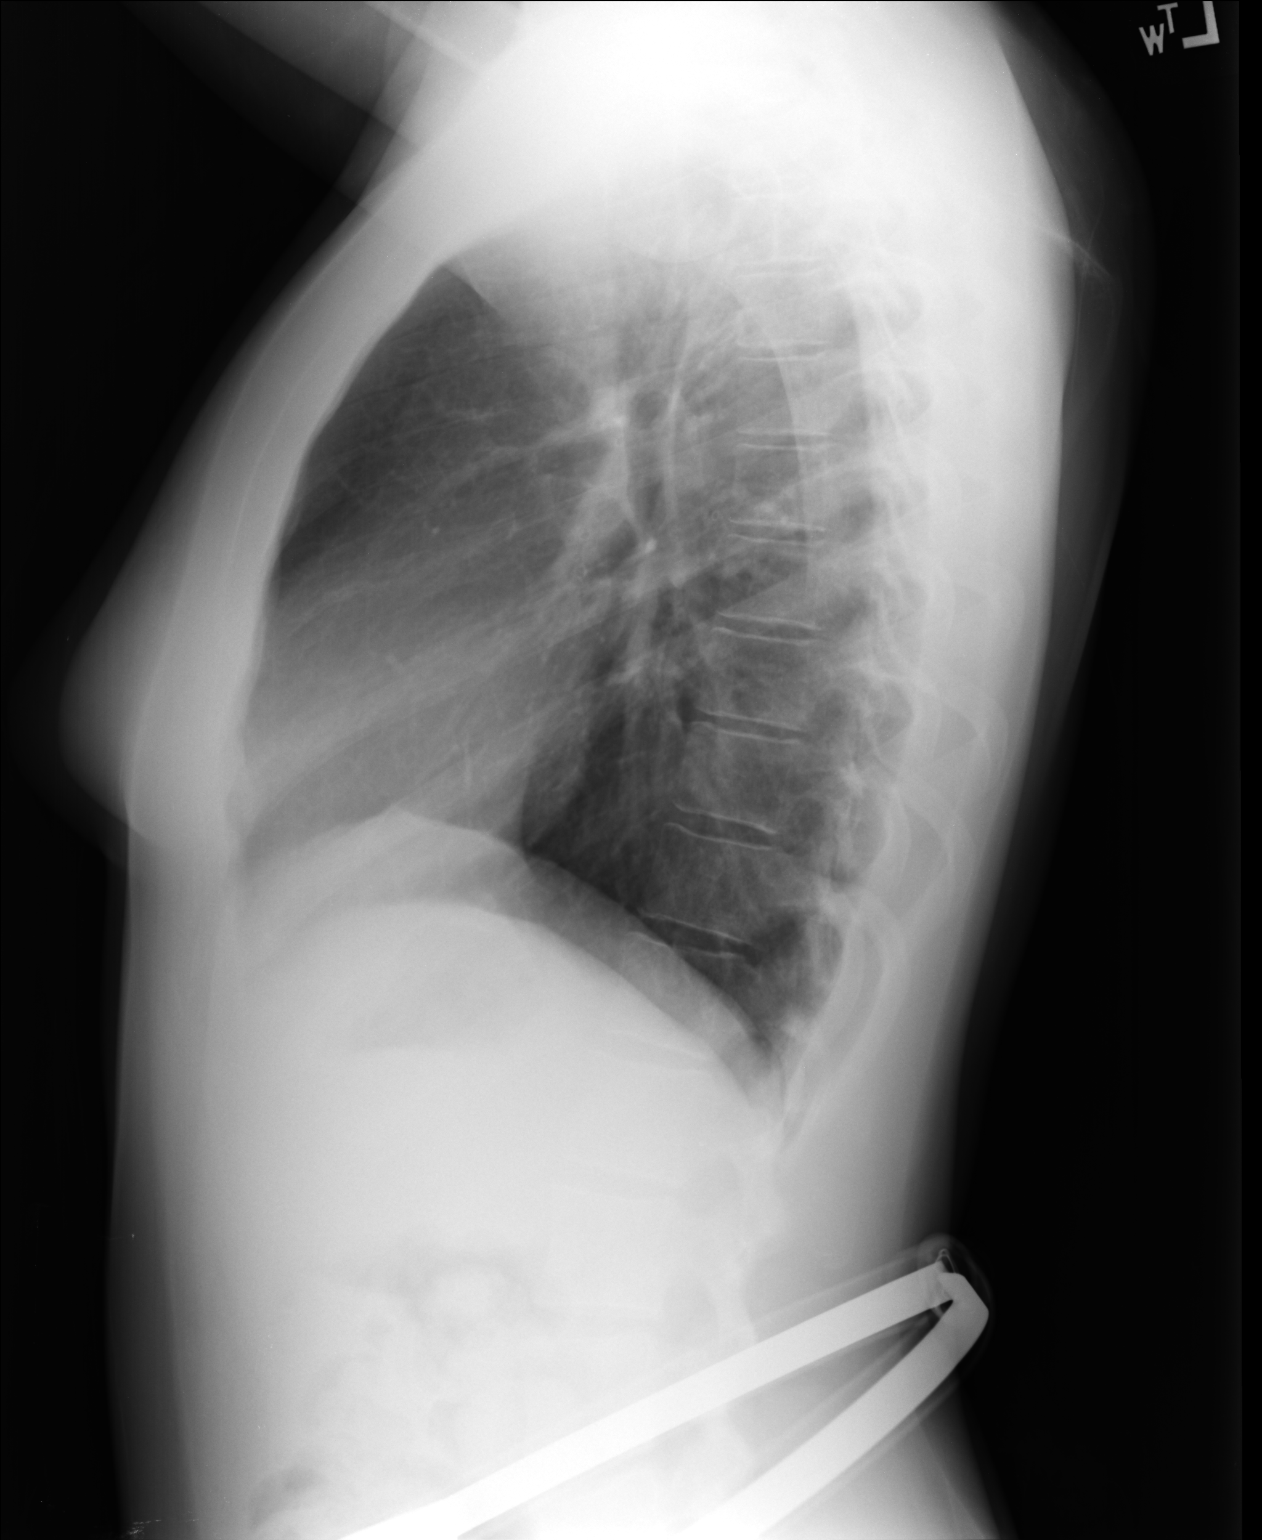

[2 of 2 positions shown; findings below may reference images not displayed]

FINDINGS: The heart size and mediastinal contours are normal. The lungs are
clear. There is no pleural effusion or pneumothorax. No acute
osseous findings are identified.
IMPRESSION: No active cardiopulmonary process.
